# Patient Record
Sex: Male | Born: 1995 | Race: Black or African American | Hispanic: No | Marital: Single | Smoking: Former smoker
Health system: Southern US, Community
[De-identification: ages and names within clinical notes are randomized; demographics above are authoritative.]

---
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
## 2022-03-30 ENCOUNTER — Emergency Department (HOSPITAL_COMMUNITY)
Admission: EM | Admit: 2022-03-30 | Discharge: 2022-03-30 | Disposition: A | Discharge status: Home / Self Care | Payer: No Typology Code available for payment source | Attending: Emergency Medicine | Admitting: Emergency Medicine

## 2022-03-30 ENCOUNTER — Other Ambulatory Visit: Payer: Self-pay

## 2022-03-30 ENCOUNTER — Encounter (HOSPITAL_COMMUNITY): Payer: Self-pay | Admitting: Emergency Medicine

## 2022-03-30 DIAGNOSIS — Y990 Civilian activity done for income or pay: Secondary | ICD-10-CM | POA: Insufficient documentation

## 2022-03-30 DIAGNOSIS — W57XXXA Bitten or stung by nonvenomous insect and other nonvenomous arthropods, initial encounter: Secondary | ICD-10-CM | POA: Diagnosis not present

## 2022-03-30 DIAGNOSIS — S70362A Insect bite (nonvenomous), left thigh, initial encounter: Secondary | ICD-10-CM | POA: Insufficient documentation

## 2022-03-30 LAB — COMPREHENSIVE METABOLIC PANEL
ALT: 18 U/L (ref 0–44)
AST: 18 U/L (ref 15–41)
Albumin: 4.4 g/dL (ref 3.5–5.0)
Alkaline Phosphatase: 39 U/L (ref 38–126)
Anion gap: 5 (ref 5–15)
BUN: 14 mg/dL (ref 6–20)
CO2: 26 mmol/L (ref 22–32)
Calcium: 9.9 mg/dL (ref 8.9–10.3)
Chloride: 108 mmol/L (ref 98–111)
Creatinine, Ser: 0.94 mg/dL (ref 0.61–1.24)
GFR, Estimated: >60 mL/min (ref >60)
Glucose, Bld: 90 mg/dL (ref 70–99)
Potassium: 4.3 mmol/L (ref 3.5–5.1)
Sodium: 139 mmol/L (ref 135–145)
Total Bilirubin: 0.9 mg/dL (ref 0.3–1.2)
Total Protein: 7.2 g/dL (ref 6.5–8.1)

## 2022-03-30 LAB — CBC WITH DIFFERENTIAL/PLATELET
Abs Immature Granulocytes: 0.01 10*3/uL (ref 0.00–0.07)
Basophils Absolute: 0 10*3/uL (ref 0.0–0.1)
Basophils Relative: 0 %
Eosinophils Absolute: 0.1 10*3/uL (ref 0.0–0.5)
Eosinophils Relative: 2 %
HCT: 42.8 % (ref 39.0–52.0)
Hemoglobin: 13.6 g/dL (ref 13.0–17.0)
Immature Granulocytes: 0 %
Lymphocytes Relative: 47 %
Lymphs Abs: 2.2 10*3/uL (ref 0.7–4.0)
MCH: 28 pg (ref 26.0–34.0)
MCHC: 31.8 g/dL (ref 30.0–36.0)
MCV: 88.2 fL (ref 80.0–100.0)
Monocytes Absolute: 0.4 10*3/uL (ref 0.1–1.0)
Monocytes Relative: 9 %
Neutro Abs: 1.9 10*3/uL (ref 1.7–7.7)
Neutrophils Relative %: 42 %
Platelets: 216 10*3/uL (ref 150–400)
RBC: 4.85 MIL/uL (ref 4.22–5.81)
RDW: 11.9 % (ref 11.5–15.5)
WBC: 4.6 10*3/uL (ref 4.0–10.5)
nRBC: 0 % (ref 0.0–0.2)

## 2022-03-30 MED ORDER — DOXYCYCLINE HYCLATE 100 MG PO TABS
100.0000 mg | ORAL_TABLET | Freq: Once | ORAL | Status: AC
  Administered 2022-03-30: 100 mg via ORAL
  Filled 2022-03-30: qty 1

## 2022-03-30 MED ORDER — PREDNISONE 20 MG PO TABS
60.0000 mg | ORAL_TABLET | Freq: Once | ORAL | Status: AC
  Administered 2022-03-30: 60 mg via ORAL
  Filled 2022-03-30: qty 3

## 2022-03-30 MED ORDER — DOXYCYCLINE HYCLATE 100 MG PO CAPS: 100.0000 mg | ORAL_CAPSULE | Freq: Two times a day (BID) | ORAL | 0 refills | Status: AC

## 2022-03-30 MED ORDER — DIPHENHYDRAMINE HCL 25 MG PO CAPS
25.0000 mg | ORAL_CAPSULE | Freq: Once | ORAL | Status: AC
  Administered 2022-03-30: 25 mg via ORAL
  Filled 2022-03-30: qty 1

## 2022-03-30 NOTE — Discharge Instructions (Addendum)
You were seen in the emergency department today for an insect bite.  I am placing you on doxycycline twice a day over the next 10 days.  This is to cover possible tickborne illness.  Please follow-up on your labs and your MyChart.  Please return if you start to feel worse or begin having fevers, nausea or vomiting, headache or changes to your vision.  Please complete your doxycycline course.  You may use hydrocortisone cream for swelling and Benadryl if you begin to have itching.

## 2022-03-30 NOTE — ED Provider Triage Note (Signed)
Emergency Medicine Provider Triage Evaluation Note  Alan Santiago , a 26 y.o. male  was evaluated in triage.  Pt complains of bug bite.  States that prior to about 6:00 tonight he was at work when he felt something sting him on his left thigh.  States that he went to the bathroom and noticed that he had redness and swelling to the left lateral thigh.  He did not see what bit him.  He denies any shortness of breath, difficulty breathing, wheezing, abdominal pain, tongue swelling..  Review of Systems  Positive: See above Negative:   Physical Exam  BP 113/74   Pulse 74   Temp 98.3 F (36.8 C) (Oral)   Resp 16   SpO2 98%  Gen:   Awake, no distress   Resp:  Normal effort, no stridor or wheezing MSK:  Moves extremities without difficulty  Other:  About 2 cm area of redness to the left lateral thigh with swelling.  No hives.  No angioedema.  Medical Decision Making  Medically screening exam initiated at 8:21 PM.  Appropriate orders placed.  Alan Santiago was informed that the remainder of the evaluation will be completed by another provider, this initial triage assessment does not replace that evaluation, and the importance of remaining in the ED until their evaluation is complete.     Alan Peru, PA-C 03/30/22 2022

## 2022-03-30 NOTE — ED Provider Notes (Signed)
[facility]-EMERGENCY DEPT Provider Note   CSN: [id number] Arrival date & time: 03/30/22  1931     History  Chief Complaint  Patient presents with   Insect Bite    Alan Santiago is a 26 y.o. male.  With no significant past medical history presents to the emergency department with bug bite.  States that he was at work today just before 6 PM when he felt something bite him on his left thigh.  He states that he went to the bathroom and noticed that he had redness and swelling to his left lateral thigh.  Was unable to see what bit him.  States that afterward he has had some itching and pain to the area.  He denies any tongue or lip swelling, wheezing, shortness of breath or difficulty breathing, abdominal pain, nausea, vomiting.  He did relate to the triage nurse that he feels like he has the flu. Also states that he pulled a tick off his dog the other day, but did not see any on himself.   HPI     Home Medications Prior to Admission medications   Medication Sig Start Date End Date Taking? Authorizing Provider  doxycycline (VIBRAMYCIN) 100 MG capsule Take 1 capsule (100 mg total) by mouth 2 (two) times daily for 10 days. 03/30/22 04/09/22 Yes Cristopher Peru, PA-C  HYDROcodone-acetaminophen (NORCO) 5-325 MG tablet Take 1 tablet by mouth every 6 (six) hours as needed for severe pain. 06/28/15   Molpus, John, MD  naproxen (NAPROSYN) 500 MG tablet Take 1 tablet (500 mg total) by mouth 2 (two) times daily. 12/19/15   Arby Barrette, MD  naproxen sodium (ANAPROX DS) 550 MG tablet Take 1 tablet twice daily as stated for back pain. 06/28/15   Molpus, Jonny Ruiz, MD      Allergies    Patient has no known allergies.    Review of Systems   Review of Systems  Skin:  Positive for rash and wound.  All other systems reviewed and are negative.   Physical Exam Updated Vital Signs BP 113/74   Pulse 74   Temp 98.3 F (36.8 C) (Oral)   Resp 16   SpO2 98%  Physical  Exam Vitals and nursing note reviewed.  HENT:     Head: Normocephalic and atraumatic.     Mouth/Throat:     Mouth: Mucous membranes are moist.     Pharynx: Oropharynx is clear.     Comments: No lip swelling or tongue swelling. Eyes:     General: No scleral icterus.    Extraocular Movements: Extraocular movements intact.     Pupils: Pupils are equal, round, and reactive to light.  Cardiovascular:     Pulses: Normal pulses.     Heart sounds: Normal heart sounds.  Pulmonary:     Effort: Pulmonary effort is normal. No respiratory distress.     Breath sounds: Normal breath sounds. No stridor. No wheezing.  Abdominal:     Palpations: Abdomen is soft.     Tenderness: There is no abdominal tenderness.  Musculoskeletal:        General: Normal range of motion.     Cervical back: Neck supple.  Skin:    General: Skin is warm and dry.     Capillary Refill: Capillary refill takes less than 2 seconds.     Findings: Erythema and rash present.  Neurological:     General: No focal deficit present.     Mental Status: He is alert and  oriented to person, place, and time. Mental status is at baseline.  Psychiatric:        Mood and Affect: Mood normal.        Behavior: Behavior normal.        Thought Content: Thought content normal.        Judgment: Judgment normal.      ED Results / Procedures / Treatments   Labs (all labs ordered are listed, but only abnormal results are displayed) Labs Reviewed  COMPREHENSIVE METABOLIC PANEL  CBC WITH DIFFERENTIAL/PLATELET  ROCKY MTN SPOTTED FVR ABS PNL(IGG+IGM)  LYME DISEASE SEROLOGY W/REFLEX  EHRLICHIA ANTIBODY PANEL   EKG None  Radiology No results found.  Procedures Procedures   Medications Ordered in ED Medications  diphenhydrAMINE (BENADRYL) capsule 25 mg (25 mg Oral Given 03/30/22 2041)  predniSONE (DELTASONE) tablet 60 mg (60 mg Oral Given 03/30/22 2041)  doxycycline (VIBRA-TABS) tablet 100 mg (100 mg Oral Given 03/30/22 2114)   ED  Course/ Medical Decision Making/ A&P                           Medical Decision Making Amount and/or Complexity of Data Reviewed Labs: ordered.  Risk Prescription drug management.  This patient presents to the ED with chief complaint(s) of bug bite with pertinent past medical history of none which further complicates the presenting complaint. The complaint involves an extensive differential diagnosis and also carries with it a high risk of complications and morbidity.    The differential diagnosis includes insect bite, tickborne illness, allergic reaction   Additional history obtained: Additional history obtained from  none available Records reviewed Care Everywhere/External Records and Primary Care Documents  ED Course and Reassessment: 26 year old male who presents to the emergency department after an insect bite.  He did not visualize what bit him. On initial exam he just appeared to be an elevated, erythematous area, but on reexam later it there appears to be some bull's-eye appearance to the plate.  I have placed in media image in the chart for reference.  There seems to be some central clearing.  Concern for possible tickborne illness.  I ordered basic labs including [facility] spotted fever, Lyme, Ehrlichia. Given a dose of doxycycline here.  Also gave him some steroids and Benadryl for the swelling and itching. There is no evidence of a systemic allergic reaction.  There is no angioedema or anaphylaxis.  No other rash.  His CMP and CBC are normal.  I will place him on 10 days of doxycycline for presumed tickborne illness.  He is encouraged to follow-up in his MyChart for the results of the RMSF, Lyme, Ehrlichia.  He verbalized understanding.  Instructed to return if he has worsening symptoms.  Instructed to follow with primary care provider as well.  Instructed he can use some hydrocortisone cream as well as Benadryl at home for the swelling and itching.  He verbalized  understanding.  Independent labs interpretation:  The following labs were independently interpreted: CBC and CMP within normal limits. RMSF, Lyme, Ehrlicia pending  Independent visualization of imaging: N/a  Consultation: - Consulted or discussed management/test interpretation w/ external professional: n/a  Consideration for admission or further workup: not indicated  Social Determinants of health: none identified  Final Clinical Impression(s) / ED Diagnoses Final diagnoses:  Insect bite of left thigh, initial encounter    Rx / DC Orders ED Discharge Orders          Ordered  doxycycline (VIBRAMYCIN) 100 MG capsule  2 times daily        03/30/22 2216              Mickie Hillier, PA-C 03/30/22 2248    Hayden Rasmussen, MD 03/31/22 [phone]

## 2022-03-30 NOTE — ED Triage Notes (Signed)
Pt with possible insect bite to left thigh.  Pt states this happened at work about 6 tonight.  Pt endorses feeling like "I have the flu"  No shob, throat or mouth swelling. Site is still painful and itches

## 2022-04-01 LAB — EHRLICHIA ANTIBODY PANEL
E chaffeensis (HGE) Ab, IgG: NEGATIVE
E chaffeensis (HGE) Ab, IgM: NEGATIVE
E. Chaffeensis (HME) IgM Titer: NEGATIVE
E.Chaffeensis (HME) IgG: NEGATIVE

## 2022-04-03 LAB — ROCKY MTN SPOTTED FVR ABS PNL(IGG+IGM)
RMSF IgG: NEGATIVE
RMSF IgM: 0.28 index (ref 0.00–0.89)

## 2022-04-04 LAB — LYME DISEASE SEROLOGY W/REFLEX: Lyme Total Antibody EIA: NEGATIVE
